# Patient Record
Sex: Male | Born: 1995 | Race: Black or African American | Hispanic: No | Marital: Single | Smoking: Never smoker
Health system: Southern US, Community
[De-identification: ages and names within clinical notes are randomized; demographics above are authoritative.]

## PROBLEM LIST (undated history)

## (undated) DIAGNOSIS — D571 Sickle-cell disease without crisis: Secondary | ICD-10-CM

---

## 2017-09-10 DIAGNOSIS — D571 Sickle-cell disease without crisis: Secondary | ICD-10-CM | POA: Diagnosis not present

## 2017-09-20 ENCOUNTER — Other Ambulatory Visit: Payer: Self-pay

## 2017-09-20 ENCOUNTER — Emergency Department (HOSPITAL_COMMUNITY): Payer: BLUE CROSS/BLUE SHIELD

## 2017-09-20 ENCOUNTER — Encounter (HOSPITAL_COMMUNITY): Payer: Self-pay | Admitting: Emergency Medicine

## 2017-09-20 ENCOUNTER — Emergency Department (HOSPITAL_COMMUNITY)
Admission: EM | Admit: 2017-09-20 | Discharge: 2017-09-21 | Disposition: A | Discharge status: Home / Self Care | Payer: BLUE CROSS/BLUE SHIELD | Attending: Emergency Medicine | Admitting: Emergency Medicine

## 2017-09-20 DIAGNOSIS — Z862 Personal history of diseases of the blood and blood-forming organs and certain disorders involving the immune mechanism: Secondary | ICD-10-CM | POA: Insufficient documentation

## 2017-09-20 DIAGNOSIS — R079 Chest pain, unspecified: Secondary | ICD-10-CM | POA: Diagnosis not present

## 2017-09-20 DIAGNOSIS — R0602 Shortness of breath: Secondary | ICD-10-CM | POA: Diagnosis not present

## 2017-09-20 HISTORY — DX: Sickle-cell disease without crisis: D57.1

## 2017-09-20 LAB — RETICULOCYTES
RBC.: 3.14 MIL/uL — ABNORMAL LOW (ref 4.22–5.81)
RETIC CT PCT: 21.6 % — AB (ref 0.4–3.1)
Retic Count, Absolute: 678.2 10*3/uL — ABNORMAL HIGH (ref 19.0–186.0)

## 2017-09-20 LAB — CBC WITH DIFFERENTIAL/PLATELET
Basophils Absolute: 0.1 10*3/uL (ref 0.0–0.1)
Basophils Relative: 1 %
EOS ABS: 0.1 10*3/uL (ref 0.0–0.7)
EOS PCT: 1 %
HCT: 24.5 % — ABNORMAL LOW (ref 39.0–52.0)
Hemoglobin: 8.7 g/dL — ABNORMAL LOW (ref 13.0–17.0)
LYMPHS ABS: 2.5 10*3/uL (ref 0.7–4.0)
Lymphocytes Relative: 22 %
MCH: 27.7 pg (ref 26.0–34.0)
MCHC: 35.5 g/dL (ref 30.0–36.0)
MCV: 78 fL (ref 78.0–100.0)
MONO ABS: 1 10*3/uL (ref 0.1–1.0)
Monocytes Relative: 9 %
NEUTROS PCT: 67 %
Neutro Abs: 7.5 10*3/uL (ref 1.7–7.7)
PLATELETS: 263 10*3/uL (ref 150–400)
RBC: 3.14 MIL/uL — AB (ref 4.22–5.81)
RDW: 25.8 % — AB (ref 11.5–15.5)
WBC: 11.2 10*3/uL — AB (ref 4.0–10.5)

## 2017-09-20 LAB — COMPREHENSIVE METABOLIC PANEL
ALK PHOS: 63 U/L (ref 38–126)
ALT: 19 U/L (ref 17–63)
AST: 39 U/L (ref 15–41)
Albumin: 4.2 g/dL (ref 3.5–5.0)
Anion gap: 10 (ref 5–15)
BILIRUBIN TOTAL: 3.5 mg/dL — AB (ref 0.3–1.2)
BUN: 10 mg/dL (ref 6–20)
CALCIUM: 9.4 mg/dL (ref 8.9–10.3)
CO2: 22 mmol/L (ref 22–32)
CREATININE: 0.63 mg/dL (ref 0.61–1.24)
Chloride: 108 mmol/L (ref 101–111)
Glucose, Bld: 98 mg/dL (ref 65–99)
Potassium: 4.5 mmol/L (ref 3.5–5.1)
Sodium: 140 mmol/L (ref 135–145)
TOTAL PROTEIN: 7.3 g/dL (ref 6.5–8.1)

## 2017-09-20 LAB — I-STAT TROPONIN, ED: Troponin i, poc: 0 ng/mL (ref 0.00–0.08)

## 2017-09-20 MED ORDER — SODIUM CHLORIDE 0.9 % IV BOLUS (SEPSIS): 1000.0000 mL | Freq: Once | INTRAVENOUS | Status: DC

## 2017-09-20 MED ORDER — HYDROMORPHONE HCL 1 MG/ML IJ SOLN: 0.5000 mg | INTRAMUSCULAR | Status: AC

## 2017-09-20 MED ORDER — HYDROMORPHONE HCL 1 MG/ML IJ SOLN: 0.5000 mg | Freq: Once | INTRAMUSCULAR | Status: DC

## 2017-09-20 MED ORDER — IOPAMIDOL (ISOVUE-370) INJECTION 76%
INTRAVENOUS | Status: AC
Start: 2017-09-20 — End: 2017-09-20
  Administered 2017-09-20: 100 mL
  Filled 2017-09-20: qty 100

## 2017-09-20 MED ORDER — HYDROMORPHONE HCL 1 MG/ML IJ SOLN
1.0000 mg | INTRAMUSCULAR | Status: AC
  Administered 2017-09-20: 1 mg via INTRAVENOUS

## 2017-09-20 MED ORDER — HYDROMORPHONE HCL 1 MG/ML IJ SOLN
0.5000 mg | INTRAMUSCULAR | Status: AC
  Administered 2017-09-20: 0.5 mg via INTRAVENOUS
  Filled 2017-09-20 (×2): qty 1

## 2017-09-20 MED ORDER — SODIUM CHLORIDE 0.45 % IV BOLUS
1000.0000 mL | Freq: Once | INTRAVENOUS | Status: AC
  Administered 2017-09-21: 1000 mL via INTRAVENOUS

## 2017-09-20 MED ORDER — HYDROMORPHONE HCL 1 MG/ML IJ SOLN: 1.0000 mg | INTRAMUSCULAR | Status: AC

## 2017-09-20 NOTE — ED Triage Notes (Signed)
Pt reports severe generalized CP, describes as "like a knife," reports pain shoots through legs. 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 Pt satting between 91-93% RA, placed on 2L [place].

## 2017-09-21 MED ORDER — DEXTROSE 5 % IV SOLN
1.0000 g | Freq: Once | INTRAVENOUS | Status: AC
  Administered 2017-09-21: 1 g via INTRAVENOUS
  Filled 2017-09-21: qty 10

## 2017-09-21 MED ORDER — DEXTROSE 5 % IV SOLN
500.0000 mg | Freq: Once | INTRAVENOUS | Status: AC
  Administered 2017-09-21: 500 mg via INTRAVENOUS
  Filled 2017-09-21: qty 500

## 2017-09-21 MED ORDER — AZITHROMYCIN 250 MG PO TABS: 250.0000 mg | ORAL_TABLET | Freq: Every day | ORAL | 0 refills | Status: DC

## 2017-09-21 NOTE — ED Notes (Signed)
While ambulating pt O2 was 89-93%

## 2017-09-21 NOTE — ED Provider Notes (Signed)
Patient with CP and some mild SOB x 2 days, both of which have completely resolved.  Patient now feels well.  Patient has normal O2 sat on room air.  Ambulates without and SOB, but O2 sat did drop briefly to 89%.  CT PE study is negative.  No evidence of infiltrate.  Patinet reassessed and is asymptomatic.  I did speak with Dr. Antionette Char on the phone about his O2 sat being 89% momentarily while walking.  This is attributed to dilaudid given in the ED.  VSS.  Well appearing.  In no acute distress and symptom free.  Both Dr. Antionette Char and myself feel that patient is stable for outpatient follow-up. Return precautions given.   Roxy Horseman, PA-C 09/21/17 0245    Zadie Rhine, MD 09/21/17 [id number]

## 2017-09-21 NOTE — ED Provider Notes (Addendum)
[facility] DEPT Provider Note   CSN: [id number] Arrival date & time: 09/20/17  1727     History   Chief Complaint Chief Complaint  Patient presents with  . Chest Pain    HPI Nicholas Navarro is a 21 y.o. male.  HPI Patient presents to the emergency department with chest pain along with some mild shortness of breath over the last 2 days.  The patient also is having some pain in his legs.  Patient recently moved to [place].  Does not have a doctor that manages his sickle cell disease.  The patient states that he did not take any medications prior to arrival for his symptoms. The patient denies headache,blurred vision, neck pain, fever, cough, weakness, numbness, dizziness, anorexia, edema, abdominal pain, nausea, vomiting, diarrhea, rash, back pain, dysuria, hematemesis, bloody stool, near syncope, or syncope. Past Medical History:  Diagnosis Date  . Sickle cell disease (HCC)     There are no active problems to display for this patient.   History reviewed. No pertinent surgical history.     Home Medications    Prior to Admission medications   Not on File    Family History No family history on file.  Social History Social History  Substance Use Topics  . Smoking status: Never Smoker  . Smokeless tobacco: Never Used  . Alcohol use No     Allergies   Patient has no known allergies.   Review of Systems Review of Systems  All other systems negative except as documented in the HPI. All pertinent positives and negatives as reviewed in the HPI.  Physical Exam Updated Vital Signs BP 106/69   Pulse 78   Temp 99.6 F (37.6 C) (Oral)   Resp (!) 22   SpO2 97%   Physical Exam  Constitutional: He is oriented to person, place, and time. He appears well-developed and well-nourished. No distress.  HENT:  Head: Normocephalic and atraumatic.  Mouth/Throat: Oropharynx is clear and moist.  Eyes: Pupils are equal, round, and reactive to light.  Neck: Normal  range of motion. Neck supple.  Cardiovascular: Normal rate, regular rhythm and normal heart sounds.  Exam reveals no gallop and no friction rub.   No murmur heard. Pulmonary/Chest: Effort normal and breath sounds normal. No respiratory distress. He has no wheezes. He exhibits no tenderness.  Abdominal: Soft. Bowel sounds are normal. He exhibits no distension. There is no tenderness.  Neurological: He is alert and oriented to person, place, and time. No sensory deficit. He exhibits normal muscle tone. Coordination normal.  Skin: Skin is warm and dry. Capillary refill takes less than 2 seconds. No rash noted. No erythema.  Psychiatric: He has a normal mood and affect. His behavior is normal.  Nursing note and vitals reviewed.    ED Treatments / Results  Labs (all labs ordered are listed, but only abnormal results are displayed) Labs Reviewed  COMPREHENSIVE METABOLIC PANEL - Abnormal; Notable for the following:       Result Value   Total Bilirubin 3.5 (*)    All other components within normal limits  CBC WITH DIFFERENTIAL/PLATELET - Abnormal; Notable for the following:    WBC 11.2 (*)    RBC 3.14 (*)    Hemoglobin 8.7 (*)    HCT 24.5 (*)    RDW 25.8 (*)    All other components within normal limits  RETICULOCYTES - Abnormal; Notable for the following:    Retic Ct Pct 21.6 (*)    RBC. 3.14 (*)  Retic Count, Absolute 678.2 (*)    All other components within normal limits  I-STAT TROPONIN, ED    EKG  EKG Interpretation None       Radiology Dg Chest 2 View  Result Date: 09/20/2017 CLINICAL DATA:  Severe generalized chest pain. History of sickle cell. EXAM: CHEST  2 VIEW COMPARISON:  None. FINDINGS: The heart size and mediastinal contours are within normal limits. Both lungs are clear. The visualized skeletal structures are unremarkable. IMPRESSION: No active cardiopulmonary disease. Electronically Signed   By: Tollie Eth M.D.   On: 09/20/2017 19:38   Ct Angio Chest Pe W/cm  &/or Wo Cm  Result Date: 09/21/2017 CLINICAL DATA:  PE suspected, high pretest prob. Central chest pain since this morning. History of sickle cell disease. EXAM: CT ANGIOGRAPHY CHEST WITH CONTRAST TECHNIQUE: Multidetector CT imaging of the chest was performed using the standard protocol during bolus administration of intravenous contrast. Multiplanar CT image reconstructions and MIPs were obtained to evaluate the vascular anatomy. CONTRAST:  Sixty-two cc Isovue 370 IV COMPARISON:  Radiograph earlier this day. FINDINGS: Cardiovascular: There are no filling defects within the pulmonary arteries to suggest pulmonary embolus. Normal caliber thoracic aorta without dissection. Mild multi chamber cardiomegaly. No pericardial effusion. 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 No adenopathy. The esophagus is decompressed. Normal thyroid gland. Lungs/Pleura: No consolidation. No pleural fluid or pulmonary edema. No pulmonary nodule. Upper Abdomen: Small calcified spleen.  No acute abnormality. Musculoskeletal: There are no acute or suspicious osseous abnormalities. Review of the MIP images confirms the above findings. IMPRESSION: 1. No pulmonary embolus.  No acute intrathoracic abnormality 2. Cardiomegaly. Electronically Signed   By: Rubye Oaks M.D.   On: 09/21/2017 00:07    Procedures Procedures (including critical care time)  Medications Ordered in ED Medications  HYDROmorphone (DILAUDID) injection 0.5 mg (not administered)  cefTRIAXone (ROCEPHIN) 1 g in dextrose 5 % 50 mL IVPB (not administered)  azithromycin (ZITHROMAX) 500 mg in dextrose 5 % 250 mL IVPB (not administered)  HYDROmorphone (DILAUDID) injection 0.5 mg (0.5 mg Intravenous Given 09/20/17 2358)    Or  HYDROmorphone (DILAUDID) injection 0.5 mg ( Subcutaneous See Alternative 09/20/17 2358)  HYDROmorphone (DILAUDID) injection 1 mg (1 mg Intravenous Given 09/20/17 2257)    Or    HYDROmorphone (DILAUDID) injection 1 mg ( Subcutaneous See Alternative 09/20/17 2257)  iopamidol (ISOVUE-370) 76 % injection (100 mLs  Contrast Given 09/20/17 2322)  sodium chloride 0.45 % bolus 1,000 mL (1,000 mLs Intravenous New Bag/Given 09/21/17 0002)     Initial Impression / Assessment and Plan / ED Course  I have reviewed the triage vital signs and the nursing notes.  Pertinent labs & imaging results that were available during my care of the patient were reviewed by me and considered in my medical decision making (see chart for details).     Patient may need admission for hypoxia.  Roxy Horseman, PA-C will follow the patient Final Clinical Impressions(s) / ED Diagnoses   Final diagnoses:  None    New Prescriptions New Prescriptions   No medications on file     Kyra Manges 09/21/17 2313    Charlynne Pander, MD 09/21/17 2320    Charlestine Night, PA-C 10/19/17 1609    Charlynne Pander, MD 10/22/17 [phone]

## 2018-01-10 ENCOUNTER — Ambulatory Visit: Payer: BLUE CROSS/BLUE SHIELD | Admitting: Medical

## 2018-01-10 ENCOUNTER — Telehealth: Payer: Self-pay | Admitting: Medical

## 2018-01-10 ENCOUNTER — Encounter: Payer: Self-pay | Admitting: Medical

## 2018-01-10 VITALS — BP 122/66 | HR 68 | Temp 98.1°F | Ht 69.0 in | Wt 133.6 lb

## 2018-01-10 DIAGNOSIS — D571 Sickle-cell disease without crisis: Secondary | ICD-10-CM | POA: Diagnosis not present

## 2018-01-10 DIAGNOSIS — R220 Localized swelling, mass and lump, head: Secondary | ICD-10-CM | POA: Diagnosis not present

## 2018-01-10 NOTE — Progress Notes (Signed)
Subjective: Chief Complaint  Patient presents with  . New Patient (Initial Visit)    swelling in face off and on for months    Here today as a new patient.  Accompanied by his older brother.  He is from [place].  He moved here recently.   He notes feeling swollen his face for about a week, but denies any other symptoms.  He does note that he was seen at the emergency department a few months ago  He notes many prior hospitalizations back in [place].  He does take folate daily.  He has not established care with the sickle cell clinic [place] in [place]  He denies any recent new exposure, no pain, no teeth pain, no fever, no sore throat ear pain or sinus pressure.  He does brush his teeth twice daily and flosses regularly  He is in school to complete his GED  Past Medical History:  Diagnosis Date  . Sickle cell disease (HCC)    Current Outpatient Medications on File Prior to Visit  Medication Sig Dispense Refill  . folic acid (FOLVITE) 1 MG tablet Take by mouth.     No current facility-administered medications on file prior to visit.    ROS as in subjective   Objective: BP 122/66   Pulse 68   Temp 98.1 F (36.7 C)   Ht 5\' 9"  (1.753 m)   Wt 133 lb 9.6 oz (60.6 kg)   SpO2 97%   BMI 19.73 kg/m   General appearance: alert, no distress, WD/WN, lean AA male HEENT: normocephalic, sclerae appears somewhat yellowish, TMs pearly, nares patent, no discharge or erythema, pharynx normal Oral cavity: MMM, left upper molar with decay, otherwise no lesions Neck: supple, no lymphadenopathy, no thyromegaly, no masses Heart: RRR, normal S1, S2, no murmurs Lungs: CTA bilaterally, no wheezes, rhonchi, or rales Abdomen: +bs, soft, non tender, non distended, no masses, no hepatomegaly, no splenomegaly Pulses: 2+ symmetric, upper and lower extremities, normal cap refill Ext: no edema    Assessment: Encounter Diagnoses  Name Primary?  Marland Kitchen. 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  Facial swelling     Plan: I reviewed chart notes in the EMR.  He does not know what his baseline hemoglobin level is, but he was in the 8 range at the hospital in September 2018.  I reviewed his visit and labs from the emergency department visit for chest pain in September 2018.  We will check stat labs today.  Of note I called the sickle cell clinic and they kept putting me on hold would not answer the phone.  We will make a referral to the sickle cell clinic [facility].  Nabil was seen today for new patient (initial visit).  Diagnoses and all orders for this visit:  Hb-SS disease without crisis (HCC) -     Comprehensive metabolic panel -     CBC with Differential/Platelet  Facial swelling -     Comprehensive metabolic panel -     CBC with Differential/Platelet

## 2018-01-10 NOTE — Patient Instructions (Signed)
Sickle Cell Resources  [facility] Cell [facility]) [address], [place], [place] [address] Phone: [phone]     [facility] (case management) [address], [place], [place] [address] Phone: [phone](336) [phone]

## 2018-01-10 NOTE — Telephone Encounter (Signed)
Refer him to Sickle Cell clinic.  He should be going there for care in general.  [facility] Cell [facility]) [address], [place], [place] [address] Phone: [phone]) [phone]

## 2018-01-10 NOTE — Telephone Encounter (Signed)
Faxed referral to sickle clinic

## 2018-01-11 LAB — CBC WITH DIFFERENTIAL/PLATELET
BASOS ABS: 0.2 10*3/uL (ref 0.0–0.2)
Basos: 3 %
EOS (ABSOLUTE): 0 10*3/uL (ref 0.0–0.4)
EOS: 0 %
HEMATOCRIT: 23.8 % — AB (ref 37.5–51.0)
HEMOGLOBIN: 8.5 g/dL — AB (ref 13.0–17.7)
Lymphocytes Absolute: 2.8 10*3/uL (ref 0.7–3.1)
Lymphs: 43 %
MCH: 28.7 pg (ref 26.6–33.0)
MCHC: 35.7 g/dL (ref 31.5–35.7)
MCV: 80 fL (ref 79–97)
MONOCYTES: 10 %
Monocytes Absolute: 0.7 10*3/uL (ref 0.1–0.9)
NRBC: 1 % — AB (ref 0–0)
Neutrophils Absolute: 2.9 10*3/uL (ref 1.4–7.0)
Neutrophils: 44 %
Platelets: 276 10*3/uL (ref 150–379)
RBC: 2.96 x10E6/uL — AB (ref 4.14–5.80)
RDW: 25.3 % — AB (ref 12.3–15.4)
WBC: 6.5 10*3/uL (ref 3.4–10.8)

## 2018-01-11 LAB — COMPREHENSIVE METABOLIC PANEL
ALT: 19 IU/L (ref 0–44)
AST: 38 IU/L (ref 0–40)
Albumin/Globulin Ratio: 1.6 (ref 1.2–2.2)
Albumin: 4.3 g/dL (ref 3.5–5.5)
Alkaline Phosphatase: 64 IU/L (ref 39–117)
BILIRUBIN TOTAL: 3.6 mg/dL — AB (ref 0.0–1.2)
BUN/Creatinine Ratio: 12 (ref 9–20)
BUN: 6 mg/dL (ref 6–20)
CHLORIDE: 107 mmol/L — AB (ref 96–106)
CO2: 21 mmol/L (ref 20–29)
CREATININE: 0.52 mg/dL — AB (ref 0.76–1.27)
Calcium: 9.1 mg/dL (ref 8.7–10.2)
GFR calc Af Amer: 175 mL/min/{1.73_m2} (ref >59)
GFR calc non Af Amer: 151 mL/min/{1.73_m2} (ref >59)
GLUCOSE: 102 mg/dL — AB (ref 65–99)
Globulin, Total: 2.7 g/dL (ref 1.5–4.5)
Potassium: 4.2 mmol/L (ref 3.5–5.2)
SODIUM: 142 mmol/L (ref 134–144)
Total Protein: 7 g/dL (ref 6.0–8.5)

## 2018-03-16 DIAGNOSIS — Z23 Encounter for immunization: Secondary | ICD-10-CM | POA: Diagnosis not present

## 2018-03-16 DIAGNOSIS — Z91048 Other nonmedicinal substance allergy status: Secondary | ICD-10-CM | POA: Diagnosis not present

## 2018-03-16 DIAGNOSIS — D571 Sickle-cell disease without crisis: Secondary | ICD-10-CM | POA: Diagnosis not present

## 2018-07-05 DIAGNOSIS — M25522 Pain in left elbow: Secondary | ICD-10-CM | POA: Diagnosis not present

## 2018-07-05 DIAGNOSIS — Z681 Body mass index (BMI) 19 or less, adult: Secondary | ICD-10-CM | POA: Diagnosis not present

## 2018-07-05 DIAGNOSIS — M25562 Pain in left knee: Secondary | ICD-10-CM | POA: Diagnosis not present

## 2018-07-05 DIAGNOSIS — Z8489 Family history of other specified conditions: Secondary | ICD-10-CM | POA: Diagnosis not present

## 2018-07-05 DIAGNOSIS — M25561 Pain in right knee: Secondary | ICD-10-CM | POA: Diagnosis not present

## 2018-07-05 DIAGNOSIS — D571 Sickle-cell disease without crisis: Secondary | ICD-10-CM | POA: Diagnosis not present

## 2018-07-05 DIAGNOSIS — Z888 Allergy status to other drugs, medicaments and biological substances status: Secondary | ICD-10-CM | POA: Diagnosis not present

## 2018-07-05 DIAGNOSIS — M25521 Pain in right elbow: Secondary | ICD-10-CM | POA: Diagnosis not present

## 2018-07-08 IMAGING — CT CT ANGIO CHEST
2 of 8 series · 19 of 46 positions shown · IV contrast (OMNI)
Comparison: Radiograph earlier this day.

CLINICAL DATA: PE suspected, high pretest prob. Central chest pain
since this morning. History of sickle cell disease.

EXAM:
CT ANGIOGRAPHY CHEST WITH CONTRAST
TECHNIQUE: Multidetector CT imaging of the chest was performed using the
standard protocol during bolus administration of intravenous
contrast. Multiplanar CT image reconstructions and MIPs were
obtained to evaluate the vascular anatomy.
CONTRAST:  Sixty-two cc Isovue 370 IV

[Series 10: thins · axial · 0.58mm/px · z∈[+1142,+1396]mm · 16 of 279 slices shown]
[im 13/279  lung]
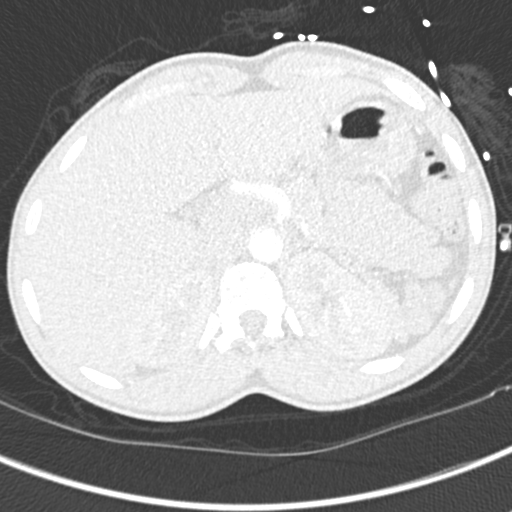
[im 26/279  soft-tissue]
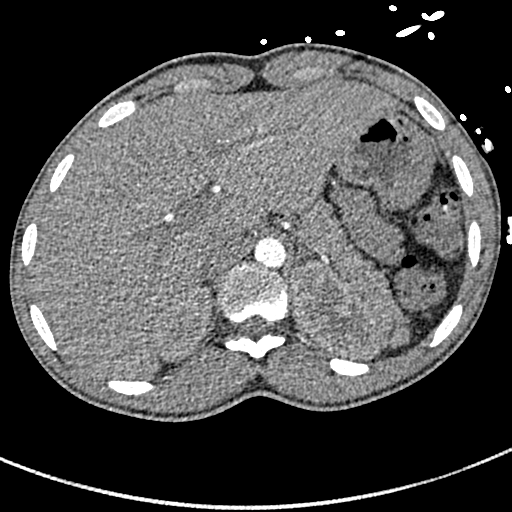
[im 51/279  lung]
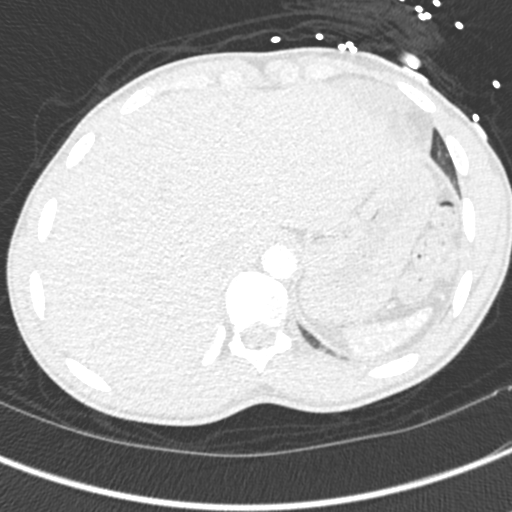
[im 64/279  soft-tissue]
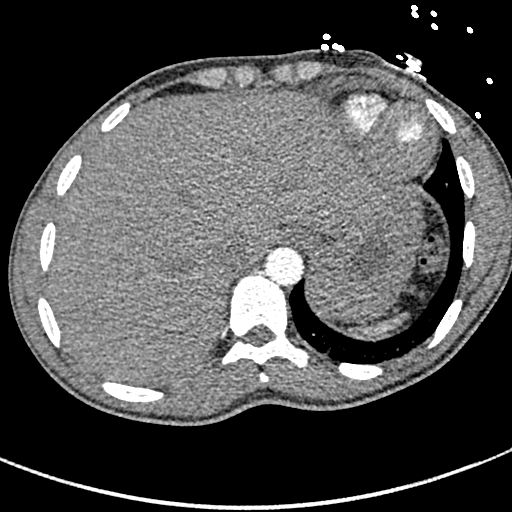
[im 76/279  lung]
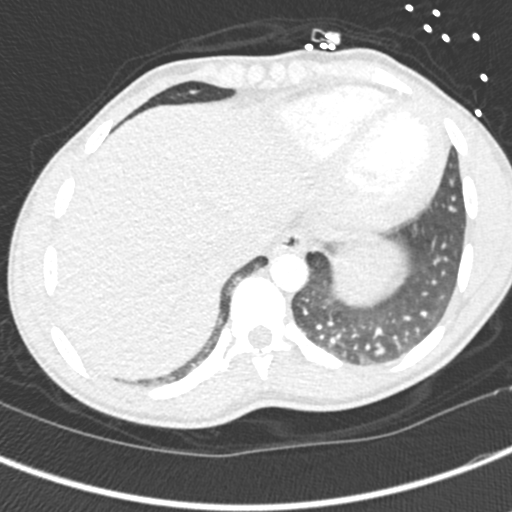
[im 102/279  soft-tissue]
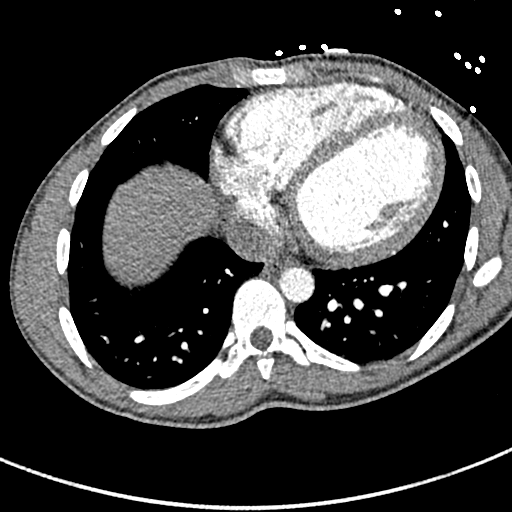
[im 114/279  lung]
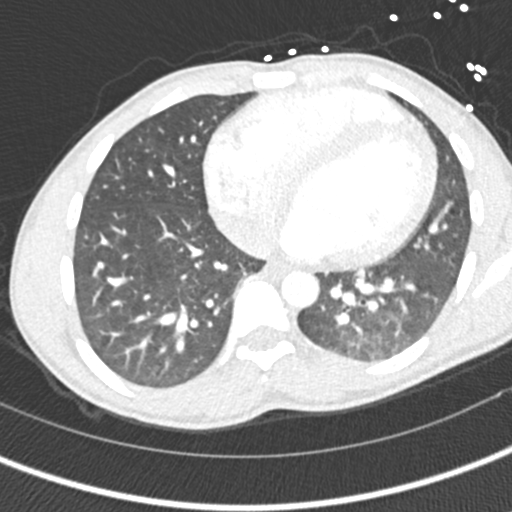
[im 127/279  soft-tissue]
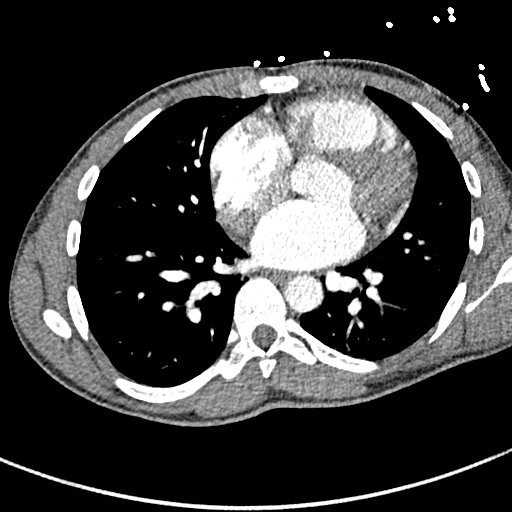
[im 152/279  lung]
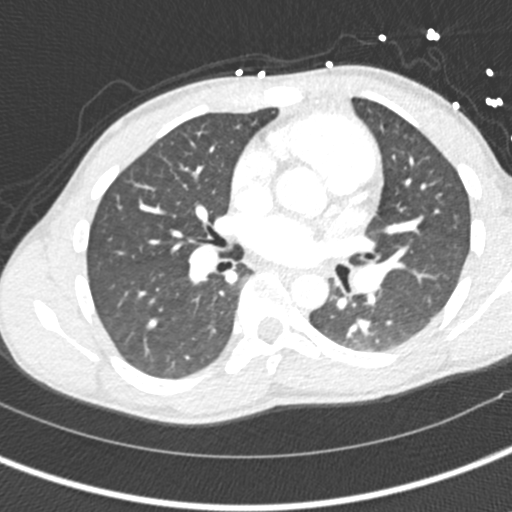
[im 165/279  soft-tissue]
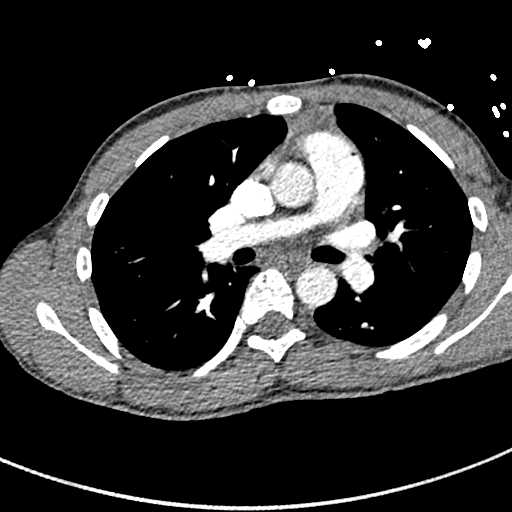
[im 177/279  lung]
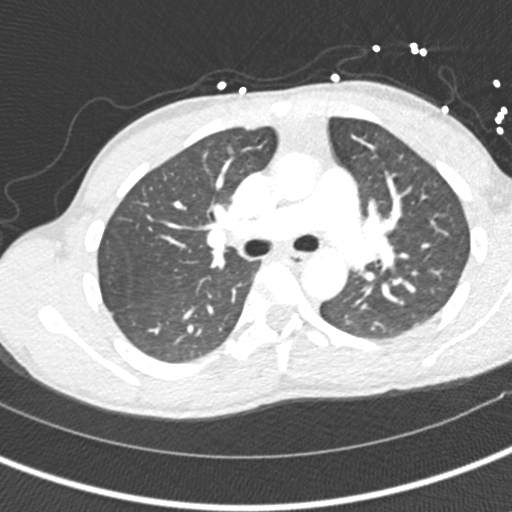
[im 203/279  soft-tissue]
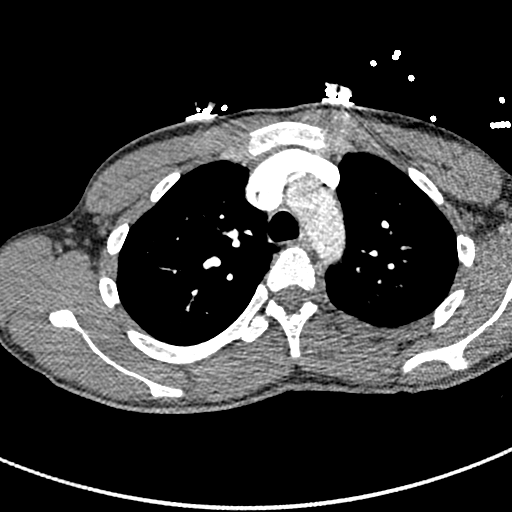
[im 215/279  lung]
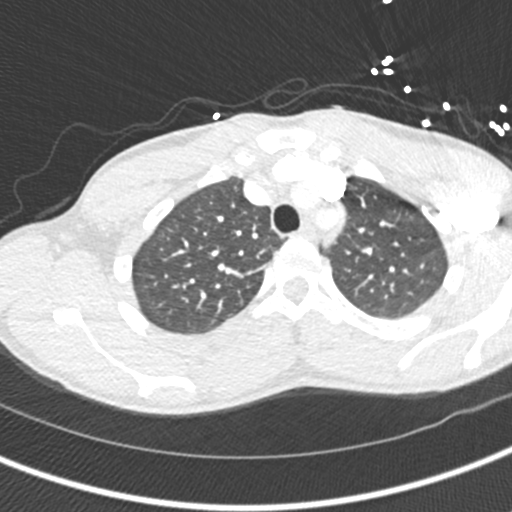
[im 228/279  soft-tissue]
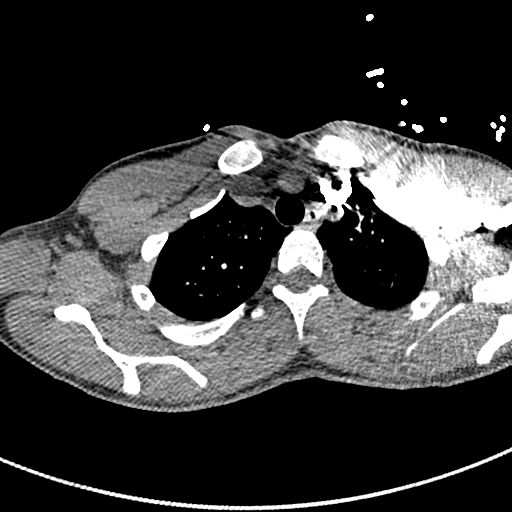
[im 253/279  lung]
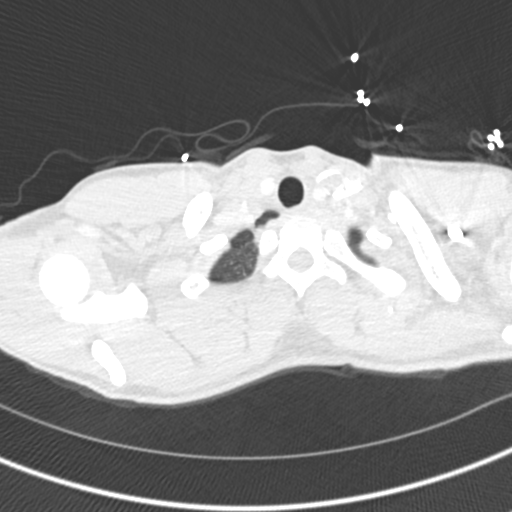
[im 266/279  soft-tissue]
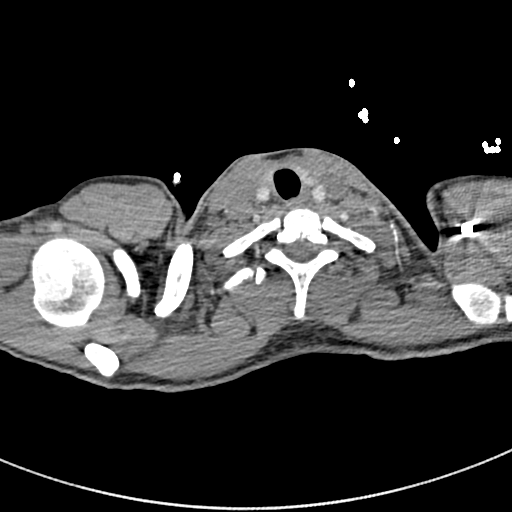

[Series 12: coronal mpr · coronal · 0.55mm/px · 3 of 111 slices shown]
[im 28/111  soft-tissue]
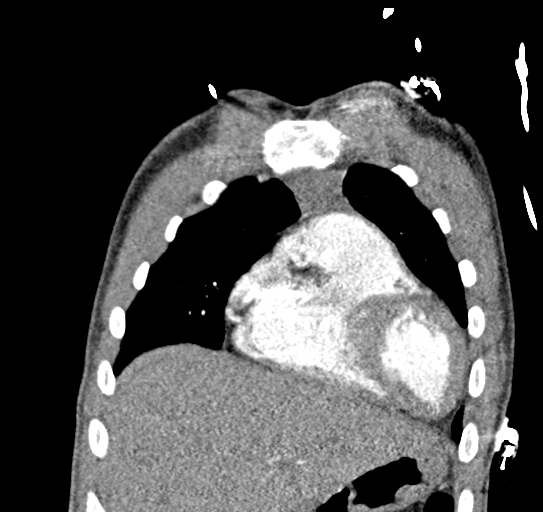
[im 56/111  soft-tissue]
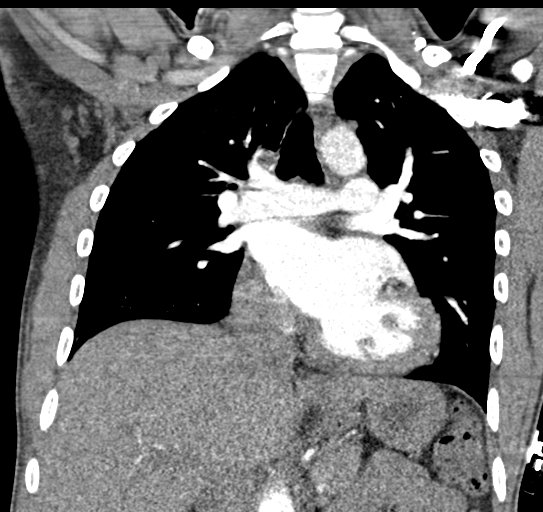
[im 83/111  soft-tissue]
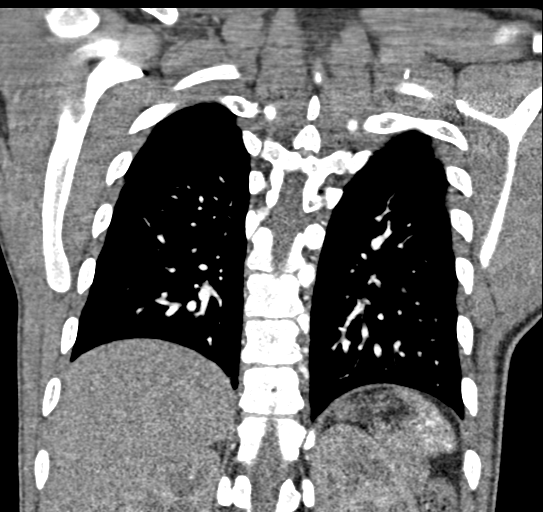

[19 of 46 positions shown; findings below may reference images not displayed]

FINDINGS: Cardiovascular: There are no filling defects within the pulmonary
arteries to suggest pulmonary embolus. Normal caliber thoracic aorta
without dissection. Mild multi chamber cardiomegaly. No pericardial
effusion.

Mediastinum/Nodes: Minimal soft tissue density in the anterior
mediastinum consistent with residual thymus. No adenopathy. The
esophagus is decompressed. Normal thyroid gland.

Lungs/Pleura: No consolidation. No pleural fluid or pulmonary edema.
No pulmonary nodule.

Upper Abdomen: Small calcified spleen.  No acute abnormality.

Musculoskeletal: There are no acute or suspicious osseous
abnormalities.

Review of the MIP images confirms the above findings.
IMPRESSION: 1. No pulmonary embolus.  No acute intrathoracic abnormality
2. Cardiomegaly.

## 2018-07-08 IMAGING — DX DG CHEST 2V
2 series · 2 of 2 positions shown · non-contrast
Comparison: None.

CLINICAL DATA: Severe generalized chest pain. History of sickle
cell.

EXAM:
CHEST  2 VIEW

[w chest pa]
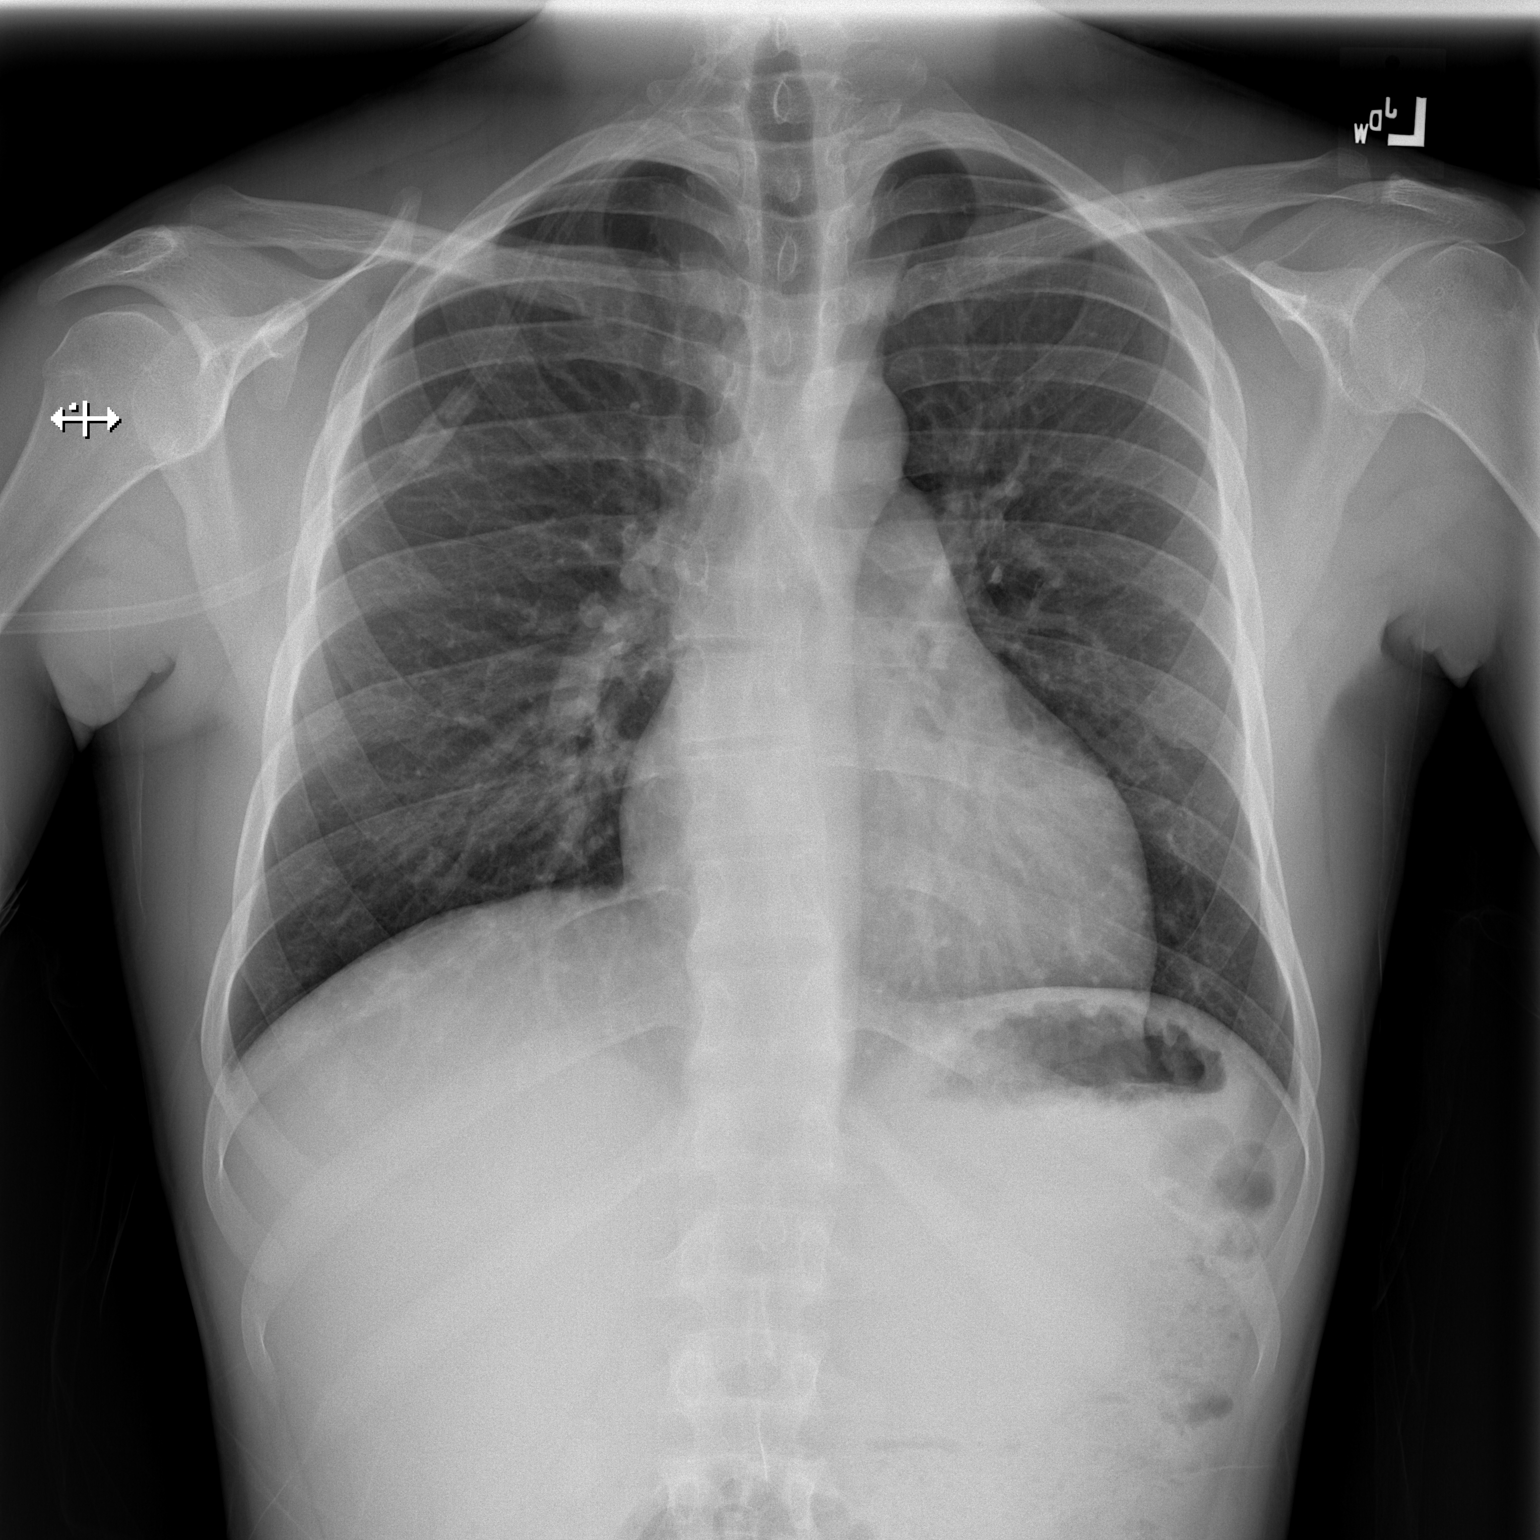

[w chest lat]
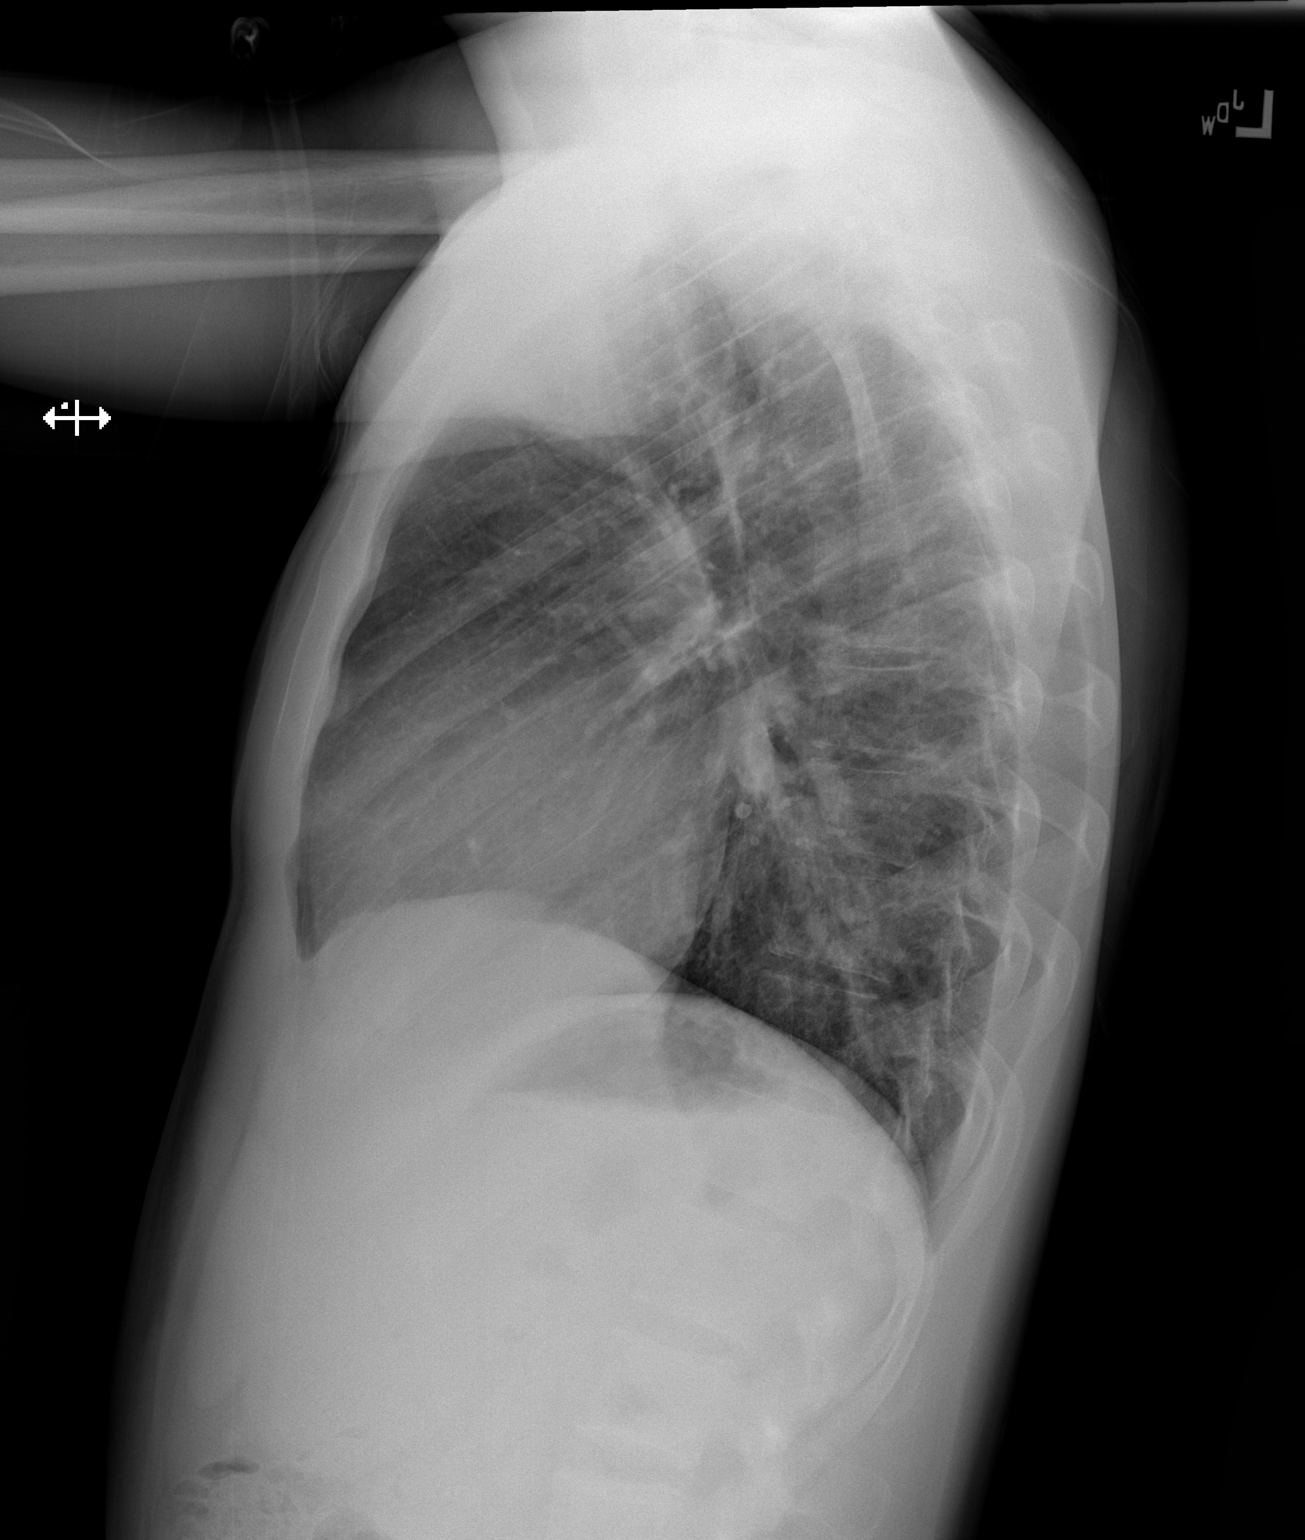

[2 of 2 positions shown; findings below may reference images not displayed]

FINDINGS: The heart size and mediastinal contours are within normal limits.
Both lungs are clear. The visualized skeletal structures are
unremarkable.
IMPRESSION: No active cardiopulmonary disease.

## 2020-09-30 DIAGNOSIS — D5700 Hb-SS disease with crisis, unspecified: Secondary | ICD-10-CM | POA: Diagnosis not present

## 2020-11-04 DIAGNOSIS — D5700 Hb-SS disease with crisis, unspecified: Secondary | ICD-10-CM | POA: Diagnosis not present

## 2020-11-04 DIAGNOSIS — Z23 Encounter for immunization: Secondary | ICD-10-CM | POA: Diagnosis not present

## 2020-11-23 DIAGNOSIS — D5700 Hb-SS disease with crisis, unspecified: Secondary | ICD-10-CM | POA: Diagnosis not present

## 2020-12-10 DIAGNOSIS — D5700 Hb-SS disease with crisis, unspecified: Secondary | ICD-10-CM | POA: Diagnosis not present

## 2020-12-10 DIAGNOSIS — R42 Dizziness and giddiness: Secondary | ICD-10-CM | POA: Diagnosis not present

## 2020-12-16 DIAGNOSIS — Z23 Encounter for immunization: Secondary | ICD-10-CM | POA: Diagnosis not present

## 2023-02-27 ENCOUNTER — Ambulatory Visit (INDEPENDENT_AMBULATORY_CARE_PROVIDER_SITE_OTHER): Payer: BLUE CROSS/BLUE SHIELD

## 2023-02-27 ENCOUNTER — Encounter (HOSPITAL_COMMUNITY): Payer: Self-pay

## 2023-02-27 ENCOUNTER — Ambulatory Visit (HOSPITAL_COMMUNITY)
Admission: EM | Admit: 2023-02-27 | Discharge: 2023-02-27 | Disposition: A | Discharge status: Home / Self Care | Payer: BLUE CROSS/BLUE SHIELD | Attending: Physician Assistant | Admitting: Physician Assistant

## 2023-02-27 DIAGNOSIS — R112 Nausea with vomiting, unspecified: Secondary | ICD-10-CM | POA: Diagnosis present

## 2023-02-27 DIAGNOSIS — R509 Fever, unspecified: Secondary | ICD-10-CM

## 2023-02-27 DIAGNOSIS — R0689 Other abnormalities of breathing: Secondary | ICD-10-CM

## 2023-02-27 LAB — CBC WITH DIFFERENTIAL/PLATELET
Abs Immature Granulocytes: 0.03 10*3/uL (ref 0.00–0.07)
Basophils Absolute: 0.1 10*3/uL (ref 0.0–0.1)
Basophils Relative: 1 %
Eosinophils Absolute: 0.2 10*3/uL (ref 0.0–0.5)
Eosinophils Relative: 2 %
HCT: 24 % — ABNORMAL LOW (ref 39.0–52.0)
Hemoglobin: 8.8 g/dL — ABNORMAL LOW (ref 13.0–17.0)
Immature Granulocytes: 0 %
Lymphocytes Relative: 48 %
Lymphs Abs: 4.5 10*3/uL — ABNORMAL HIGH (ref 0.7–4.0)
MCH: 30.8 pg (ref 26.0–34.0)
MCHC: 36.7 g/dL — ABNORMAL HIGH (ref 30.0–36.0)
MCV: 83.9 fL (ref 80.0–100.0)
Monocytes Absolute: 0.9 10*3/uL (ref 0.1–1.0)
Monocytes Relative: 10 %
Neutro Abs: 3.6 10*3/uL (ref 1.7–7.7)
Neutrophils Relative %: 39 %
Platelets: 244 10*3/uL (ref 150–400)
RBC: 2.86 MIL/uL — ABNORMAL LOW (ref 4.22–5.81)
RDW: 24.8 % — ABNORMAL HIGH (ref 11.5–15.5)
WBC: 9.3 10*3/uL (ref 4.0–10.5)
nRBC: 2.2 % — ABNORMAL HIGH (ref 0.0–0.2)

## 2023-02-27 LAB — POCT URINALYSIS DIPSTICK, ED / UC
Bilirubin Urine: NEGATIVE
Glucose, UA: NEGATIVE mg/dL
Hgb urine dipstick: NEGATIVE
Ketones, ur: NEGATIVE mg/dL
Leukocytes,Ua: NEGATIVE
Nitrite: NEGATIVE
Protein, ur: NEGATIVE mg/dL
Specific Gravity, Urine: 1.015 (ref 1.005–1.030)
Urobilinogen, UA: 1 mg/dL (ref 0.0–1.0)
pH: 7.5 (ref 5.0–8.0)

## 2023-02-27 LAB — COMPREHENSIVE METABOLIC PANEL
ALT: 36 U/L (ref 0–44)
AST: 54 U/L — ABNORMAL HIGH (ref 15–41)
Albumin: 4 g/dL (ref 3.5–5.0)
Alkaline Phosphatase: 75 U/L (ref 38–126)
Anion gap: 10 (ref 5–15)
BUN: 9 mg/dL (ref 6–20)
CO2: 23 mmol/L (ref 22–32)
Calcium: 9.3 mg/dL (ref 8.9–10.3)
Chloride: 107 mmol/L (ref 98–111)
Creatinine, Ser: 0.68 mg/dL (ref 0.61–1.24)
GFR, Estimated: >60 mL/min (ref >60)
Glucose, Bld: 74 mg/dL (ref 70–99)
Potassium: 4.5 mmol/L (ref 3.5–5.1)
Sodium: 140 mmol/L (ref 135–145)
Total Bilirubin: 3.8 mg/dL — ABNORMAL HIGH (ref 0.3–1.2)
Total Protein: 7.7 g/dL (ref 6.5–8.1)

## 2023-02-27 MED ORDER — FOLIC ACID 1 MG PO TABS: 1.0000 mg | ORAL_TABLET | Freq: Every day | ORAL | 1 refills | Status: AC

## 2023-02-27 MED ORDER — ONDANSETRON 4 MG PO TBDP: 4.0000 mg | ORAL_TABLET | Freq: Three times a day (TID) | ORAL | 0 refills | Status: AC | PRN

## 2023-02-27 NOTE — ED Triage Notes (Addendum)
Patient c/o  fever, headache, dizziness, and emesis x 4 days.  Patient states he took Tylenol last night.  Patient added that he needs a prescription for Folic acid.

## 2023-02-27 NOTE — Discharge Instructions (Signed)
Your chest x-ray was normal.  Your urine was normal.  I am glad that you are not having symptoms right now.  Take Zofran 3 times a day.  Eat a bland diet and drink plenty of fluids.  We will contact you if your lab work is abnormal.  If you have recurrent nausea/vomiting despite medication or have any worsening symptoms including severe headache, fever, chest pain, abdominal pain you need to go to the emergency room immediately.

## 2023-02-27 NOTE — ED Provider Notes (Signed)
[place]    CSN: [id number] Arrival date & time: 02/27/23  1257      History   Chief Complaint Chief Complaint  Patient presents with   Cough   Fever   Emesis   Dizziness   Medication Refill    HPI Nicholas Navarro is a 27 y.o. male.   Patient presents today with a 5-day history of symptoms.  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  Usually within a few hours of getting to work he develops symptoms including severe headache, nausea/vomiting, cough, subjective fever.  He has not recorded his temperature and is unsure how high this has been.  He denies any new exposures including to chemicals.  He denies any known sick contacts.  Denies any significant congestion, sore throat.  Denies any associated abdominal pain or diarrhea.  He denies any associated shortness of breath or chest pain.  He has tried over-the-counter medication with minimal improvement of symptoms.  Denies any recent antibiotics or steroids.  He does have a history of sickle cell disease but denies any increase in pain.  Denies any history of allergies, asthma, COPD, smoking.  He has been able to eat and drink despite symptoms and has no significant concern for dehydration.    Past Medical History:  Diagnosis Date   Sickle cell disease ([place])     There are no problems to display for this patient.   History reviewed. No pertinent surgical history.     Home Medications    Prior to Admission medications   Medication Sig Start Date End Date Taking? Authorizing Provider  ondansetron (ZOFRAN-ODT) 4 MG disintegrating tablet Take 1 tablet (4 mg total) by mouth every 8 (eight) hours as needed for nausea or vomiting. 02/27/23  Yes Orlyn Odonoghue, Derry Skill, PA-C  folic acid (FOLVITE) 1 MG tablet Take 1 tablet (1 mg total) by mouth daily. 02/27/23   Lilla Callejo, Derry Skill, PA-C    Family History History reviewed. No pertinent family history.  Social History Social History   Tobacco Use    Smoking status: Never   Smokeless tobacco: Never  Vaping Use   Vaping Use: Never used  Substance Use Topics   Alcohol use: No   Drug use: No     Allergies   Patient has no known allergies.   Review of Systems Review of Systems  Constitutional:  Positive for activity change and fever. Negative for appetite change and fatigue.  HENT:  Negative for congestion, sinus pressure, sneezing and sore throat.   Respiratory:  Positive for cough. Negative for shortness of breath.   Cardiovascular:  Negative for chest pain.  Gastrointestinal:  Positive for nausea and vomiting. Negative for abdominal pain and diarrhea.  Neurological:  Positive for dizziness and headaches. Negative for seizures, syncope, speech difficulty, weakness and light-headedness.     Physical Exam Triage Vital Signs ED Triage Vitals  Enc Vitals Group     BP 02/27/23 1341 120/69     Pulse Rate 02/27/23 1341 78     Resp 02/27/23 1341 14     Temp 02/27/23 1341 98.4 F (36.9 C)     Temp Source 02/27/23 1341 Oral     SpO2 02/27/23 1341 91 %     Weight --      Height --      Head Circumference --      Peak Flow --      Pain Score 02/27/23 1342 4  Pain Loc --      Pain Edu? --      Excl. in [place]? --    No data found.  Updated Vital Signs BP 120/69 (BP Location: Left Arm)   Pulse 67   Temp 98.4 F (36.9 C) (Oral)   Resp 14   SpO2 95%   Visual Acuity Right Eye Distance:   Left Eye Distance:   Bilateral Distance:    Right Eye Near:   Left Eye Near:    Bilateral Near:     Physical Exam Vitals reviewed.  Constitutional:      General: He is awake.     Appearance: Normal appearance. He is well-developed. He is not ill-appearing.     Comments: Very pleasant male appears stated age in no acute distress sitting comfortably in exam room  HENT:     Head: Normocephalic and atraumatic.     Right Ear: Tympanic membrane, ear canal and external ear normal. Tympanic membrane is not erythematous or bulging.      Left Ear: Tympanic membrane, ear canal and external ear normal. Tympanic membrane is not erythematous or bulging.     Nose: Nose normal.     Mouth/Throat:     Pharynx: Uvula midline. No oropharyngeal exudate or posterior oropharyngeal erythema.  Cardiovascular:     Rate and Rhythm: Normal rate and regular rhythm.     Heart sounds: Normal heart sounds, S1 normal and S2 normal. No murmur heard. Pulmonary:     Effort: Pulmonary effort is normal. No accessory muscle usage or respiratory distress.     Breath sounds: No stridor. Examination of the right-lower field reveals decreased breath sounds. Examination of the left-lower field reveals decreased breath sounds. Decreased breath sounds present. No wheezing, rhonchi or rales.  Chest:     Chest wall: No deformity, swelling or tenderness.  Abdominal:     General: Bowel sounds are normal.     Palpations: Abdomen is soft.     Tenderness: There is no abdominal tenderness. There is no right CVA tenderness, left CVA tenderness, guarding or rebound.     Comments: Benign abdominal exam.  Neurological:     Mental Status: He is alert.  Psychiatric:        Behavior: Behavior is cooperative.      UC Treatments / Results  Labs (all labs ordered are listed, but only abnormal results are displayed) Labs Reviewed  CBC WITH DIFFERENTIAL/PLATELET  COMPREHENSIVE METABOLIC PANEL  POCT URINALYSIS DIPSTICK, ED / UC    EKG   Radiology DG Chest 2 View  Result Date: 02/27/2023 CLINICAL DATA:  Fever, headache, dizziness, decreased oxygen saturation EXAM: CHEST - 2 VIEW COMPARISON:  09/20/2017 FINDINGS: The heart size and mediastinal contours are within normal limits. Both lungs are clear. The visualized skeletal structures are unremarkable. IMPRESSION: No active cardiopulmonary disease. Electronically Signed   By: Jerilynn Mages.  Shick M.D.   On: 02/27/2023 14:10    Procedures Procedures (including critical care time)  Medications Ordered in UC Medications - No  data to display  Initial Impression / Assessment and Plan / UC Course  I have reviewed the triage vital signs and the nursing notes.  Pertinent labs & imaging results that were available during my care of the patient were reviewed by me and considered in my medical decision making (see chart for details).     Patient is well-appearing, afebrile, nontoxic, nontachycardic.  On intake his oxygen saturation was between 90 and 91%.  Chest x-ray was obtained to  investigate this finding which showed no acute abnormality.  No indication for emergent evaluation or imaging based on exam today.  On recheck oxygen saturation improved to 95%.  Low suspicion for COVID versus flu given no congestion or cough.  Concern for viral GI bug.  UA was obtained that was normal.  CBC and CMP were obtained and are pending.  He was given Zofran to help manage his nausea and vomiting symptoms and recommend to eat a bland diet and drink plenty of fluid.  Discussed that if he has any worsening symptoms including abdominal pain, chest pain, shortness of breath, fever nausea/vomiting despite antiemetic medication he needs to go to the emergency room to which she expressed understanding.  Strict return precautions given.  Work excuse note provided.  Discussed case with Dr. Windy Carina who agreed with treatment plan.  Final Clinical Impressions(s) / UC Diagnoses   Final diagnoses:  Nausea and vomiting, unspecified vomiting type     Discharge Instructions      Your chest x-ray was normal.  Your urine was normal.  I am glad that you are not having symptoms right now.  Take Zofran 3 times a day.  Eat a bland diet and drink plenty of fluids.  We will contact you if your lab work is abnormal.  If you have recurrent nausea/vomiting despite medication or have any worsening symptoms including severe headache, fever, chest pain, abdominal pain you need to go to the emergency room immediately.     ED Prescriptions     Medication Sig  Dispense Auth. Provider   folic acid (FOLVITE) 1 MG tablet Take 1 tablet (1 mg total) by mouth daily. 30 tablet Maziah Smola K, PA-C   ondansetron (ZOFRAN-ODT) 4 MG disintegrating tablet Take 1 tablet (4 mg total) by mouth every 8 (eight) hours as needed for nausea or vomiting. 20 tablet Shanta Dorvil, Derry Skill, PA-C      PDMP not reviewed this encounter.   Terrilee Croak, PA-C 02/27/23 1436
